# Patient Record
Sex: Male | Born: 1975 | Race: Black or African American | Hispanic: No | Marital: Single | Smoking: Former smoker
Health system: Southern US, Community
[De-identification: ages and names within clinical notes are randomized; demographics above are authoritative.]

## PROBLEM LIST (undated history)

## (undated) DIAGNOSIS — I2510 Atherosclerotic heart disease of native coronary artery without angina pectoris: Secondary | ICD-10-CM

## (undated) HISTORY — PX: CORONARY ARTERY BYPASS GRAFT: SHX141

## (undated) HISTORY — PX: CARDIAC CATHETERIZATION: SHX172

---

## 2001-02-02 ENCOUNTER — Emergency Department (HOSPITAL_COMMUNITY): Admission: EM | Admit: 2001-02-02 | Discharge: 2001-02-02 | Payer: Self-pay | Admitting: *Deleted

## 2002-05-17 ENCOUNTER — Emergency Department (HOSPITAL_COMMUNITY): Admission: EM | Admit: 2002-05-17 | Discharge: 2002-05-17 | Payer: Self-pay | Admitting: Emergency Medicine

## 2005-01-20 ENCOUNTER — Ambulatory Visit (HOSPITAL_BASED_OUTPATIENT_CLINIC_OR_DEPARTMENT_OTHER): Admission: RE | Admit: 2005-01-20 | Discharge: 2005-01-20 | Payer: Self-pay | Admitting: Otolaryngology

## 2005-01-24 ENCOUNTER — Ambulatory Visit: Payer: Self-pay | Admitting: Internal Medicine

## 2005-08-16 ENCOUNTER — Emergency Department (HOSPITAL_COMMUNITY): Admission: EM | Admit: 2005-08-16 | Discharge: 2005-08-16 | Payer: Self-pay | Admitting: Emergency Medicine

## 2006-06-12 ENCOUNTER — Ambulatory Visit (HOSPITAL_COMMUNITY): Admission: RE | Admit: 2006-06-12 | Discharge: 2006-06-13 | Payer: Self-pay | Admitting: Otolaryngology

## 2006-06-12 ENCOUNTER — Encounter (INDEPENDENT_AMBULATORY_CARE_PROVIDER_SITE_OTHER): Payer: Self-pay | Admitting: Specialist

## 2010-06-15 NOTE — H&P (Signed)
NAME:  Sergio Tyler, Sergio Tyler NO.:  [id number]   MEDICAL RECORD NO.:  [id number]          PATIENT TYPE:  AMB   LOCATION:  SDS                          FACILITY:  [facility]   PHYSICIAN:  Hermelinda Medicus, M.D.   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  He  has a considerable amount of trauma to his nose and also is a very  athletic individual with a very strong full neck.  He has considerable  snoring and sleep apnea issues and had a sleep study, which showed an  RDI of 44 and a lowest O2 nadir of 58%.  He had spent 19% of his time in  deep sleep, or REM sleep.  He has a septal deviation, turbinate  hypertrophy.  His nasal airway is very obstructed.  His oral cavity is  small.  His mouth is small with tonsils large and uvula that is low.  He  now enters for a septal reconstruction, turbinate reduction, and a UPP  with tonsillectomy.   The past history is one of no allergies to medications, drinks  occasionally, never smoked.  He has had no prior surgeries.  He has  seasonal allergies and takes over-the-counter medications.  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   PHYSICAL EXAMINATION:  VITAL SIGNS:  Reveals a blood pressure that is  slightly elevated at 164/97, pulse is 98, respiratory rate is 20.  Weight is 97.9.  His original SaO2 is 94% on room air.  HEENT:  The septum shows a deviation, primarily to the left with  turbinate hypertrophy that is extensive, that is blocking is nose.  His  oral cavity is small.  His tonsils are large.  His uvula is large, with  his palate being low.  His larynx is clear.  True cords, false cords,  epiglottis, base of tongue are clear.  True cord mobility, gag  reflex,  tongue mobility, EOMs, facial nerve are all symmetrical.  His neck is  free of any thyromegaly, cervical adenopathy, or mass.  CHEST:  Clear.  No rales, rhonchi, or wheezes.  CARDIOVASCULAR:  No murmurs or gallops.  ABDOMEN:  Unremarkable.  EXTREMITIES:  Unremarkable.   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   Our plan is to do a palatopharyngoplasty, tonsillectomy, septal  reconstruction, turbinate reduction under general endotracheal  anesthesia.  Will keep him overnight for observation.  The patient is  aware of the risks and gains.  He is aware of the dietetic needs and no  travel for ten days, and he is aware of the risk of bleeding, the risk  of anesthesia, the risk of postoperative sleep apnea.  ______________________________  Hermelinda Medicus, M.D.    JC/MEDQ  D:  06/12/2006  T:  06/12/2006  Job:  [id number]   cc:   Reuben Likes, M.D.

## 2010-06-15 NOTE — Op Note (Signed)
NAME:  Sergio Tyler, Sergio Tyler                  ACCOUNT NO.:  [id number]   MEDICAL RECORD NO.:  [id number]         PATIENT TYPE:  COIB   LOCATION:                               FACILITY:  [facility]   PHYSICIAN:  Hermelinda Medicus, M.D.   DATE OF BIRTH:  Aug 13, 1975   DATE OF PROCEDURE:  06/12/2006  DATE OF DISCHARGE:                               OPERATIVE REPORT   PREOPERATIVE DIAGNOSES:  Septal deviation, turbinate hypertrophy,  tonsillar hypertrophy with redundant uvula and palate, with sleep apnea,  Respiratory Disorder Index of 44, O2 nadir 58%, REM sleep 19%.   POSTOPERATIVE DIAGNOSES:  Septal deviation, turbinate hypertrophy,  tonsillar hypertrophy with redundant uvula and palate, with sleep apnea,  Respiratory Disorder Index of 44, O2 nadir 58%, REM sleep 19%.   OPERATION:  Septal reconstruction, turbinate reduction,  uvulopalatoplasty, and a tonsillectomy.   SURGEON:  Hermelinda Medicus, M.D.   ANESTHESIA:  General endotracheal.   PROCEDURE:  The patient was placed in supine position, and under general  endotracheal anesthesia, the patient was prepped and draped in the  appropriate manner.  The anesthesia was continued using local  anesthesia, using 1% Xylocaine with epinephrine 6 mL, and topical  cocaine 200 mg.  Once this was completed, the turbinates were  outfractured and aggressively reduced using the bipolar cautery set at  12.  The septum was then approached.  It was so severely scarred, we  made 2 incisions, 1 along the vomerine septum, and we approached that  using the 4-mm chisel and the Takahashi forceps, removed that portion of  the septal deviation, and then an ethmoid septal incision was made to  approach the deviation in that portion.  The columella incision was not  made because there was so much scar tissue in that area from previous  trauma.  Once this was corrected and the septum was in the midline,  closure was with 4-0 plain catgut and the mucous membrane incisions were  with 5-0 plain.  Plain 4-0 catgut was used as a through-and-through  septal suture as a hemostatic suture.  Once this was completed, the  septum was in the midline.  The turbinates were aggressively  outfractured and we had considerable nasal airway and a huge  improvement.  We then repositioned and placed the [facility] mouth gag in a  quite small mouth with a low uvula and palate and fairly large tonsils,  and removed the tonsils to gain some more space.  This was done using  the sharp, blunt and Bovie electrocoagulation dissection, and once the  tonsils were removed, we gained considerable space.  The uvula was  approximately 3 times its normal size, and this was trimmed back to a  more normal saline, and the palate was elevated also by trimming  approximately 7 mm.  All hemostasis was again checked.  The intranasal  dressing was removed and anesthesia trumpets were placed, and the  patient was taken to the recovery room in good condition.  He will be  kept overnight for observation at 3300, and will be kept on a pulse  oximeter.  His followup will be, then, in 3 days, and in 10 days, and 2  weeks, 3 weeks, 6 weeks, and 3 months and 6 months.          ______________________________  Hermelinda Medicus, M.D.    JC/MEDQ  D:  06/12/2006  T:  06/12/2006  Job:  [id number]   cc:   Reuben Likes, M.D.

## 2010-06-18 NOTE — Procedures (Signed)
NAME:  Sergio Tyler, SANFILIPPO NO.:  [id number]   MEDICAL RECORD NO.:  [id number]          PATIENT TYPE:  OUT   LOCATION:  SLEEP CENTER                 FACILITY:  [facility]   PHYSICIAN:  Clinton D. Maple Hudson, M.D. DATE OF BIRTH:  [birth year]   DATE OF STUDY:  01/20/2005                              NOCTURNAL POLYSOMNOGRAM   REFERRING PHYSICIAN:  Dr. Hermelinda Medicus.   DATE OF STUDY:  January 20, 2005.   INDICATIONS FOR STUDY:  Hypersomnia with sleep apnea. Epworth sleepiness  score 8/24, BMI 30, weight 214 pounds.   SLEEP ARCHITECTURE:  Total sleep time 406 minutes with sleep efficiency 98%.  Stage I was 1%, stage II 80%, stages III and IV absent, REM 19% of total  sleep time. Sleep latency 2 minutes, REM latency 74 minutes, awake after  sleep onset 5 minutes, arousal index 39.   RESPIRATORY DATA:  NPSG protocol. Apnea-hypopnea index (AHI, RDI) 44  obstructive events per hour indicating moderately severe obstructive sleep  apnea/hypopnea syndrome. There were 134 obstructive apneas and 164  hypopneas. Events were significant in all positions but most frequent while  supine. REM AHI 44 per hour.   OXYGEN DATA:  Very severe snoring especially while supine with oxygen  desaturation to a nadir of 58%. Mean oxygen saturation through the study was  94% on room air.   CARDIAC DATA:  Normal sinus rhythm.   MOVEMENT/PARASOMNIA:  Occasional leg jerk with little effect on sleep.   IMPRESSION/RECOMMENDATION:  1.  Moderately severe obstructive sleep apnea/hypopnea syndrome, apnea-      hypopnea index 44 per hour, worst while supine, with very loud snoring      and oxygen desaturation to a nadir of 58%.  2.  If clinically appropriate this patient could return for continuous      positive airway pressure titration and certainly should be considered      for therapeutic intervention.      Clinton D. Maple Hudson, M.D.  Diplomate, Biomedical engineer of Sleep Medicine  Electronically  Signed    CDY/MEDQ  D:  01/24/2005 17:20:26  T:  01/24/2005 22:09:16  Job:  [id number]

## 2012-03-25 ENCOUNTER — Emergency Department (HOSPITAL_COMMUNITY)
Admission: EM | Admit: 2012-03-25 | Discharge: 2012-03-25 | Disposition: A | Discharge status: Home / Self Care | Payer: PRIVATE HEALTH INSURANCE | Source: Home / Self Care | Attending: Family Medicine | Admitting: Family Medicine

## 2012-03-25 ENCOUNTER — Encounter (HOSPITAL_COMMUNITY): Payer: Self-pay | Admitting: *Deleted

## 2012-03-25 DIAGNOSIS — L250 Unspecified contact dermatitis due to cosmetics: Secondary | ICD-10-CM

## 2012-03-25 MED ORDER — FLUTICASONE PROPIONATE 0.05 % EX CREA: TOPICAL_CREAM | Freq: Two times a day (BID) | CUTANEOUS | Status: DC

## 2012-03-25 NOTE — ED Notes (Signed)
Patient complains of rash around beard and mouth x 2 weeks after using beard dye. Patient states rash will not resolve after 2 weeks of OTC treatment with ointment recommended by pharmacist. Patient states he now has fever and it is painful to eat.

## 2012-03-25 NOTE — ED Provider Notes (Signed)
History     CSN: [id number]  Arrival date & time 03/25/12  1538   First MD Initiated Contact with Patient 03/25/12 [phone]      Chief Complaint  Patient presents with  . Rash    (Consider location/radiation/quality/duration/timing/severity/associated sxs/prior treatment) Patient is a 37 y.o. male presenting with rash. The history is provided by the patient.  Rash Location:  Face Facial rash location:  Lip and chin Quality: blistering, itchiness and weeping   Severity:  Mild Duration:  2 weeks Progression:  Unchanged Chronicity:  New Context comment:  After using dye for beard.   History reviewed. No pertinent past medical history.  History reviewed. No pertinent past surgical history.  No family history on file.  History  Substance Use Topics  . Smoking status: Never Smoker   . Smokeless tobacco: Not on file  . Alcohol Use: Yes     Comment: occasional      Review of Systems  Constitutional: Negative.   Skin: Positive for rash.    Allergies  Review of patient's allergies indicates no known allergies.  Home Medications   Current Outpatient Rx  Name  Route  Sig  Dispense  Refill  . fluticasone (CUTIVATE) 0.05 % cream   Topical   Apply topically 2 (two) times daily.   30 g   0     BP 156/105  Pulse 86  Temp(Src) 99.2 F (37.3 C) (Oral)  Resp 16  SpO2 97%  Physical Exam  Nursing note and vitals reviewed. Constitutional: He is oriented to person, place, and time. He appears well-developed and well-nourished.  Neurological: He is alert and oriented to person, place, and time.  Skin: Skin is warm and dry. Rash noted.  Perioral facial weeping blistering rash.    ED Course  Procedures (including critical care time)  Labs Reviewed - No data to display No results found.   1. Contact dermatitis due to cosmetics       MDM          Linna Hoff, MD 03/25/12 1651

## 2012-06-17 ENCOUNTER — Encounter (HOSPITAL_COMMUNITY): Payer: Self-pay | Admitting: *Deleted

## 2012-06-17 ENCOUNTER — Emergency Department (INDEPENDENT_AMBULATORY_CARE_PROVIDER_SITE_OTHER)
Admission: EM | Admit: 2012-06-17 | Discharge: 2012-06-17 | Disposition: A | Discharge status: Home / Self Care | Payer: PRIVATE HEALTH INSURANCE | Source: Home / Self Care | Attending: Emergency Medicine | Admitting: Emergency Medicine

## 2012-06-17 DIAGNOSIS — L259 Unspecified contact dermatitis, unspecified cause: Secondary | ICD-10-CM

## 2012-06-17 MED ORDER — METHYLPREDNISOLONE ACETATE 80 MG/ML IJ SUSP
80.0000 mg | Freq: Once | INTRAMUSCULAR | Status: AC
  Administered 2012-06-17: 80 mg via INTRAMUSCULAR

## 2012-06-17 MED ORDER — METHYLPREDNISOLONE ACETATE 80 MG/ML IJ SUSP
INTRAMUSCULAR | Status: AC
  Filled 2012-06-17: qty 1

## 2012-06-17 MED ORDER — FLUTICASONE PROPIONATE 0.05 % EX CREA: TOPICAL_CREAM | Freq: Two times a day (BID) | CUTANEOUS | Status: DC

## 2012-06-17 MED ORDER — PREDNISONE 10 MG PO TABS: ORAL_TABLET | ORAL | Status: DC

## 2012-06-17 NOTE — ED Notes (Signed)
Pt    Reports  Facial  Swelling  l  Side  Face  After  Applying  Hair  Dye   sev  Days  Ago  The  Symptoms  Began   Later  On  In the       Day    After  Using the  Dye -  He  Is  Alert and  Oriented  And  Is  In no  Distress

## 2012-06-17 NOTE — ED Provider Notes (Signed)
Chief Complaint:   Chief Complaint  Patient presents with  . Allergic Reaction    History of Present Illness:   Sergio Tyler is a 37 year old male who applied a hair dye to his beard area 3 days ago. He felt a little tingling and itching in the area and soon thereafter broke out in a rash involving the entire beard area. His left upper lip swelled up. He denies any rash elsewhere, he's had no trouble breathing, wheezing, or swelling of his tongue or throat. He has had reactions to hair dyes in the past.  Review of Systems:  Other than noted above, the patient denies any of the following symptoms: Systemic:  No fever, chills, sweats, weight loss, or fatigue. ENT:  No nasal congestion, rhinorrhea, sore throat, swelling of lips, tongue or throat. Resp:  No cough, wheezing, or shortness of breath. Skin:  No rash, itching, nodules, or suspicious lesions.  PMFSH:  Past medical history, family history, social history, meds, and allergies were reviewed.  Physical Exam:   Vital signs:  BP 166/93  Pulse 74  Temp(Src) 100 F (37.8 C) (Oral)  Resp 17  SpO2 100% Gen:  Alert, oriented, in no distress. ENT:  Pharynx clear, no intraoral lesions, moist mucous membranes. Lungs:  Clear to auscultation. Skin:  There is a fine maculopapular rash involving the entire beard area where the dye was applied. Additionally there is a little bit of swelling of the left upper lip.  Course in Urgent Care Center:   Given Depo-Medrol 80 mg IM.  Assessment:  The encounter diagnosis was Contact dermatitis.  Plan:   1.  The following meds were prescribed:   New Prescriptions   FLUTICASONE (CUTIVATE) 0.05 % CREAM    Apply topically 2 (two) times daily.   PREDNISONE (DELTASONE) 10 MG TABLET    Take 4 tabs daily for 4 days, 3 tabs daily for 4 days, 2 tabs daily for 4 days, then 1 tab daily for 4 days.   2.  The patient was instructed in symptomatic care and handouts were given. He was cautioned not to apply any type  of hair or beard dye again in the future. 3.  The patient was told to return if becoming worse in any way, if no better in 3 or 4 days, and given some red flag symptoms such as worsening rash or difficulty breathing or fever that would indicate earlier return. 4.  Follow up if no better in 3-4 days here.     Reuben Likes, MD 06/17/12 [phone]

## 2012-12-15 ENCOUNTER — Encounter (HOSPITAL_COMMUNITY): Payer: Self-pay | Admitting: Emergency Medicine

## 2012-12-15 ENCOUNTER — Emergency Department (HOSPITAL_COMMUNITY)
Admission: EM | Admit: 2012-12-15 | Discharge: 2012-12-16 | Disposition: A | Discharge status: Home / Self Care | Payer: PRIVATE HEALTH INSURANCE | Attending: Emergency Medicine | Admitting: Emergency Medicine

## 2012-12-15 DIAGNOSIS — K08409 Partial loss of teeth, unspecified cause, unspecified class: Secondary | ICD-10-CM

## 2012-12-15 DIAGNOSIS — Y849 Medical procedure, unspecified as the cause of abnormal reaction of the patient, or of later complication, without mention of misadventure at the time of the procedure: Secondary | ICD-10-CM | POA: Insufficient documentation

## 2012-12-15 DIAGNOSIS — IMO0002 Reserved for concepts with insufficient information to code with codable children: Secondary | ICD-10-CM | POA: Insufficient documentation

## 2012-12-15 DIAGNOSIS — R58 Hemorrhage, not elsewhere classified: Secondary | ICD-10-CM

## 2012-12-15 MED ORDER — THROMBIN 5000 UNITS EX SOLR
Freq: Once | CUTANEOUS | Status: AC
  Administered 2012-12-15: 5000 [IU] via TOPICAL
  Filled 2012-12-15: qty 5000

## 2012-12-15 MED ORDER — "THROMBI-PAD 3""X3"" EX PADS": 1.0000 | MEDICATED_PAD | Freq: Once | CUTANEOUS | Status: DC

## 2012-12-15 NOTE — ED Notes (Signed)
Pt states he was at the free dental clinic today in [place]. He had dental work done at [facility] where they removed roots from a root canal that he had several years ago. He states he was injected/ numbed 9 times or more. Since 5pm, pt has not been able to get bleeding under control.

## 2012-12-15 NOTE — ED Notes (Signed)
Today, he had three teeth extracted in [place] at free clinic.  Has been bleeding since and gauze keeps saturating to the point that he keeps spitting blood.

## 2012-12-15 NOTE — ED Notes (Signed)
MD at bedside. 

## 2012-12-15 NOTE — ED Provider Notes (Signed)
CSN: [id number]     Arrival date & time 12/15/12  2124 History   First MD Initiated Contact with Patient 12/15/12 2148     Chief Complaint  Patient presents with  . Extraction Complication    (Consider location/radiation/quality/duration/timing/severity/associated sxs/prior Treatment) HPI Comments: Patient is a 37 year old male who presents today after having 3 teeth extracted he free dental clinic in [place] today. He is having issues with his left lower bicuspid socket bleeding. He has soaked through several towels and continues to bleed. He has had teeth pulled in the past and never had this issue. He is not bleeding from the other 2 sites that he had pulled today. He denies being on any anticoagulation, aspirin. He reports that he has no liver disease. He is in pain from the incision site.   The history is provided by the patient. No language interpreter was used.    History reviewed. No pertinent past medical history. History reviewed. No pertinent past surgical history. History reviewed. No pertinent family history. History  Substance Use Topics  . Smoking status: Never Smoker   . Smokeless tobacco: Not on file  . Alcohol Use: Yes     Comment: occasional    Review of Systems  Constitutional: Negative for fever and chills.  Respiratory: Negative for shortness of breath.   Cardiovascular: Negative for chest pain.  Gastrointestinal: Negative for vomiting and abdominal pain.  Hematological:       Bleeding from gum.   All other systems reviewed and are negative.    Allergies  Review of patient's allergies indicates no known allergies.  Home Medications   Current Outpatient Rx  Name  Route  Sig  Dispense  Refill  . fluticasone (CUTIVATE) 0.05 % cream   Topical   Apply topically 2 (two) times daily.   30 g   0   . fluticasone (CUTIVATE) 0.05 % cream   Topical   Apply topically 2 (two) times daily.   30 g   0   . predniSONE (DELTASONE) 10 MG tablet     Take 4 tabs daily for 4 days, 3 tabs daily for 4 days, 2 tabs daily for 4 days, then 1 tab daily for 4 days.   40 tablet   0    BP 136/93  Pulse 87  Temp(Src) 98.7 F (37.1 C) (Oral)  Resp 18  Ht 6' (1.829 m)  Wt 215 lb (97.523 kg)  BMI 29.15 kg/m2  SpO2 98% Physical Exam  Nursing note and vitals reviewed. Constitutional: He is oriented to person, place, and time. He appears well-developed and well-nourished. No distress.  HENT:  Head: Normocephalic and atraumatic.  Right Ear: External ear normal.  Left Ear: External ear normal.  Nose: Nose normal.  Mouth/Throat: Uvula is midline and oropharynx is clear and moist.    No trismus, submental edema, tongue elevation  Eyes: Conjunctivae are normal.  Neck: Normal range of motion. No tracheal deviation present.  Cardiovascular: Normal rate, regular rhythm and normal heart sounds.   Pulmonary/Chest: Effort normal and breath sounds normal. No stridor.  Abdominal: Soft. He exhibits no distension. There is no tenderness.  Musculoskeletal: Normal range of motion.  Neurological: He is alert and oriented to person, place, and time.  Skin: Skin is warm and dry. He is not diaphoretic.  Psychiatric: He has a normal mood and affect. His behavior is normal.    ED Course  Dental Date/Time: 12/15/2012 11:56 PM Performed by: Mora Bellman Authorized by: Mora Bellman  Consent: Verbal consent obtained. written consent not obtained. The procedure was performed in an emergent situation. Risks and benefits: risks, benefits and alternatives were discussed Consent given by: patient Patient understanding: patient states understanding of the procedure being performed Patient consent: the patient's understanding of the procedure matches consent given Required items: required blood products, implants, devices, and special equipment available Patient identity confirmed: verbally with patient and arm band Time out: Immediately prior to  procedure a "time out" was called to verify the correct patient, procedure, equipment, support staff and site/side marked as required. Local anesthesia used: yes Anesthesia: local infiltration and see MAR for details Local anesthetic: bupivacaine 0.25% with epinephrine Anesthetic total: 3.2 ml Patient sedated: no Patient tolerance: Patient tolerated the procedure well with no immediate complications.   (including critical care time) Labs Review Labs Reviewed - No data to display Imaging Review No results found.  EKG Interpretation   None       MDM   1. S/P tooth extraction, unspecified edentulism   2. Bleeding    Patient presents after dental extraction earlier today. Bleeding was not controlled initially upon arrival to the emergency department in socket of left lower bicuspid. Bleeding was controlled with use of a thrombin with gauze. Gel foam placed in the socket. Bleeding was ultimately controlled. Pt was observed in ED to ensure no further bleeding. Dr. Lynelle Doctor evaluated patient and assisted with procedure. Return instructions given and dental follow up given. Vital signs stable for discharge. Patient / Family / Caregiver informed of clinical course, understand medical decision-making process, and agree with plan.    Mora Bellman, PA-C 12/16/12 0120

## 2012-12-16 MED ORDER — MORPHINE SULFATE 4 MG/ML IJ SOLN
4.0000 mg | Freq: Once | INTRAMUSCULAR | Status: AC
  Administered 2012-12-16: 4 mg via INTRAMUSCULAR
  Filled 2012-12-16: qty 1

## 2012-12-16 MED ORDER — ONDANSETRON 8 MG PO TBDP
8.0000 mg | ORAL_TABLET | Freq: Once | ORAL | Status: AC
  Administered 2012-12-16: 8 mg via ORAL
  Filled 2012-12-16: qty 1

## 2012-12-16 NOTE — ED Notes (Addendum)
Patient is resting comfortably. Bleeding minimized, not using yanker as much.

## 2012-12-19 NOTE — ED Provider Notes (Signed)
Medical screening examination/treatment/procedure(s) were conducted as a shared visit with non-physician practitioner(s) and myself.  I personally evaluated the patient during the encounter.    Physical Exam  BP 149/89  Pulse 80  Temp(Src) 98.7 F (37.1 C) (Oral)  Resp 18  Ht 6' (1.829 m)  Wt 215 lb (97.523 kg)  BMI 29.15 kg/m2  SpO2 96%  Physical Exam  HENT:  Blood oozing primarily from the extracted teeth left lower mandibular region,     ED Course  Dental Date/Time: 12/19/2012 7:15 AM Performed by: Linwood Dibbles R Authorized by: Linwood Dibbles R Patient tolerance: Patient tolerated the procedure well with no immediate complications. Comments: Gelfoam placed in the sockets, thrombin soaked gauze applied above that and patient held pressure by biting down.  Bleeding was controlled    MDM After several attempts, bleeding was controlled.      Celene Kras, MD 12/19/12 [phone]

## 2015-12-03 ENCOUNTER — Telehealth (HOSPITAL_COMMUNITY): Payer: Self-pay | Admitting: *Deleted

## 2015-12-03 NOTE — Telephone Encounter (Signed)
Received signed md [facility]  Pt called and message left for him to please contact for additional information and sign up for class.  [facility] who [facility] to inquire status of cardiac rehab referral. Karlene Linemanarlette Ash Mcelwain RN, BSN

## 2015-12-31 ENCOUNTER — Encounter (HOSPITAL_COMMUNITY): Payer: Self-pay

## 2015-12-31 ENCOUNTER — Encounter (HOSPITAL_COMMUNITY)
Admission: RE | Admit: 2015-12-31 | Discharge: 2015-12-31 | Disposition: A | Discharge status: Home / Self Care | Payer: 59 | Source: Ambulatory Visit | Attending: Cardiology | Admitting: Cardiology

## 2015-12-31 VITALS — BP 142/92 | HR 68 | Ht 72.0 in | Wt 215.4 lb

## 2015-12-31 DIAGNOSIS — Z7982 Long term (current) use of aspirin: Secondary | ICD-10-CM | POA: Insufficient documentation

## 2015-12-31 DIAGNOSIS — I252 Old myocardial infarction: Secondary | ICD-10-CM | POA: Insufficient documentation

## 2015-12-31 DIAGNOSIS — Z7902 Long term (current) use of antithrombotics/antiplatelets: Secondary | ICD-10-CM | POA: Diagnosis not present

## 2015-12-31 DIAGNOSIS — I214 Non-ST elevation (NSTEMI) myocardial infarction: Secondary | ICD-10-CM

## 2015-12-31 DIAGNOSIS — Z79899 Other long term (current) drug therapy: Secondary | ICD-10-CM | POA: Insufficient documentation

## 2015-12-31 DIAGNOSIS — Z951 Presence of aortocoronary bypass graft: Secondary | ICD-10-CM | POA: Insufficient documentation

## 2015-12-31 HISTORY — DX: Atherosclerotic heart disease of native coronary artery without angina pectoris: I25.10

## 2015-12-31 NOTE — Progress Notes (Signed)
Cardiac Individual Treatment Plan  Patient Details  Name: Sergio Tyler MRN: [id number] Date of Birth: 1975/07/23 Referring Provider:   Flowsheet Row CARDIAC REHAB PHASE II ORIENTATION from 12/31/2015 in [facility] CARDIAC [facility]  Referring Provider  Harvie Bridge MD (Dr. Armanda Magic coverage)      Initial Encounter Date:  Flowsheet Row CARDIAC REHAB PHASE II ORIENTATION from 12/31/2015 in [facility] CARDIAC REHAB  Date  12/31/15  Referring Provider  Harvie Bridge MD (Dr. 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  aspirin EC 81 MG tablet, Take 81 mg by mouth daily., Disp: , Rfl:  .  atorvastatin (LIPITOR) 80 MG tablet, Take 80 mg by mouth daily at 6 PM., Disp: , Rfl:  .  clopidogrel (PLAVIX) 75 MG tablet, Take 75 mg by mouth daily., Disp: , Rfl:  .  lisinopril (PRINIVIL,ZESTRIL) 10 MG tablet, Take 10 mg by mouth daily., Disp: , Rfl:  .  metoprolol (TOPROL-XL) 200 MG 24 hr tablet, Take 200 mg by mouth daily., Disp: , Rfl:   Past Medical History: No past medical history on file.  Tobacco Use: History  Smoking Status  . Never Smoker  Smokeless Tobacco  . Not on file    Labs: Recent Review Flowsheet Data    There is no flowsheet data to display.      Capillary Blood Glucose: No results found for: GLUCAP   Exercise Target Goals: Date: 12/31/15  Exercise Program Goal: Individual exercise prescription set with THRR, safety & activity barriers. Participant demonstrates ability to understand and report RPE using BORG scale, to self-measure pulse accurately, and to acknowledge the importance of the exercise prescription.  Exercise Prescription Goal: Starting with aerobic activity 30 plus minutes a day, 3 days per week for initial exercise prescription. Provide home  exercise prescription and guidelines that participant acknowledges understanding prior to discharge.  Activity Barriers & Risk Stratification:     Activity Barriers & Cardiac Risk Stratification - 12/31/15 1121      Activity Barriers & Cardiac Risk Stratification   Activity Barriers Other (comment)   Comments pain with arm movement ( horz. abd/add)   Cardiac Risk Stratification High      6 Minute Walk:     6 Minute Walk    Row Name 12/31/15 1153         6 Minute Walk   Phase Initial     Distance 1910 feet     Walk Time 6 minutes     # of Rest Breaks 0     MPH 3.6     METS 5.7     RPE 11     VO2 Peak 20.1     Symptoms No     Resting HR 68 bpm     Resting BP 142/92     Max Ex. HR 102 bpm     Max Ex. BP 158/104     2 Minute Post BP 134/94        Initial Exercise Prescription:     Initial Exercise Prescription - 12/31/15 1100      Date of Initial Exercise RX and Referring Provider   Date 12/31/15   Referring Provider Harvie Bridge MD (Dr. Armanda Magic coverage)     Treadmill   MPH 3   Grade 1   Minutes 10  METs 3.71     Bike   Level 1.3   Minutes 10   METs 3.45     NuStep   Level 4   Minutes 10   METs 2.4     Prescription Details   Frequency (times per week) 3   Duration Progress to 30 minutes of continuous aerobic without signs/symptoms of physical distress     Intensity   THRR 40-80% of Max Heartrate 72-143   Ratings of Perceived Exertion 11-13     Resistance Training   Training Prescription Yes   Weight 4lbs   Reps 10-12      Perform Capillary Blood Glucose checks as needed.  Exercise Prescription Changes:   Exercise Comments:   Discharge Exercise Prescription (Final Exercise Prescription Changes):   Nutrition:  Target Goals: Understanding of nutrition guidelines, daily intake of sodium 1500mg , cholesterol 200mg , calories 30% from fat and 7% or less from saturated fats, daily to have 5 or more servings of fruits and  vegetables.  Biometrics:     Pre Biometrics - 12/31/15 1211      Pre Biometrics   Waist Circumference 38.75 inches   Hip Circumference 42 inches   Waist to Hip Ratio 0.92 %   Triceps Skinfold 14 mm   % Body Fat 25.9 %   Grip Strength 56 kg   Flexibility 0 in   Single Leg Stand 30 seconds       Nutrition Therapy Plan and Nutrition Goals:   Nutrition Discharge: Nutrition Scores:   Nutrition Goals Re-Evaluation:   Psychosocial: Target Goals: Acknowledge presence or absence of depression, maximize coping skills, provide positive support system. Participant is able to verbalize types and ability to use techniques and skills needed for reducing stress and depression.  Initial Review & Psychosocial Screening:     Initial Psych Review & Screening - 12/31/15 1434      Initial Review   Current issues with Current Stress Concerns   Source of Stress Concerns Occupation;Financial   Comments Verbalizes financial concerns due to not working and unsure of when he will be able to return to work.      Quality of Life Scores:     Quality of Life - 12/31/15 1221      Quality of Life Scores   Health/Function Pre 26.5 %   Socioeconomic Pre 21.25 %   Psych/Spiritual Pre 29.64 %   Family Pre 20.5 %   GLOBAL Pre 25.07 %      PHQ-9: Recent Review Flowsheet Data    There is no flowsheet data to display.      Psychosocial Evaluation and Intervention:   Psychosocial Re-Evaluation:   Vocational Rehabilitation: Provide vocational rehab assistance to qualifying candidates.   Vocational Rehab Evaluation & Intervention:     Vocational Rehab - 12/31/15 1437      Initial Vocational Rehab Evaluation & Intervention   Assessment shows need for Vocational Rehabilitation (P)  No     Discharge Vocational Rehab   Discharge Vocational Rehabilitation (P)  Although pt has not been cleared to retun back to work he feels he will be able to return back to work without any difficulty.       Education: Education Goals: Education classes will be provided on a weekly basis, covering required topics. Participant will state understanding/return demonstration of topics presented.  Learning Barriers/Preferences:     Learning Barriers/Preferences - 12/31/15 1209      Learning Barriers/Preferences   Learning Barriers Exercise Concerns  dizziness- orthostatics  Learning Preferences Written Material;Video;Skilled Demonstration;Pictoral;Audio      Education Topics: Count Your Pulse:  -Group instruction provided by verbal instruction, demonstration, patient participation and written materials to support subject.  Instructors address importance of being able to find your pulse and how to count your pulse when at home without a heart monitor.  Patients get hands on experience counting their pulse with staff help and individually.   Heart Attack, Angina, and Risk Factor Modification:  -Group instruction provided by verbal instruction, video, and written materials to support subject.  Instructors address signs and symptoms of angina and heart attacks.    Also discuss risk factors for heart disease and how to make changes to improve heart health risk factors.   Functional Fitness:  -Group instruction provided by verbal instruction, demonstration, patient participation, and written materials to support subject.  Instructors address safety measures for doing things around the house.  Discuss how to get up and down off the floor, how to pick things up properly, how to safely get out of a chair without assistance, and balance training.   Meditation and Mindfulness:  -Group instruction provided by verbal instruction, patient participation, and written materials to support subject.  Instructor addresses importance of mindfulness and meditation practice to help reduce stress and improve awareness.  Instructor also leads participants through a meditation exercise.    Stretching for  Flexibility and Mobility:  -Group instruction provided by verbal instruction, patient participation, and written materials to support subject.  Instructors lead participants through series of stretches that are designed to increase flexibility thus improving mobility.  These stretches are additional exercise for major muscle groups that are typically performed during regular warm up and cool down.   Hands Only CPR Anytime:  -Group instruction provided by verbal instruction, video, patient participation and written materials to support subject.  Instructors co-teach with AHA video for hands only CPR.  Participants get hands on experience with mannequins.   Nutrition I class: Heart Healthy Eating:  -Group instruction provided by PowerPoint slides, verbal discussion, and written materials to support subject matter. The instructor gives an explanation and review of the Therapeutic Lifestyle Changes diet recommendations, which includes a discussion on lipid goals, dietary fat, sodium, fiber, plant stanol/sterol esters, sugar, and the components of a well-balanced, healthy diet.   Nutrition II class: Lifestyle Skills:  -Group instruction provided by PowerPoint slides, verbal discussion, and written materials to support subject matter. The instructor gives an explanation and review of label reading, grocery shopping for heart health, heart healthy recipe modifications, and ways to make healthier choices when eating out.   Diabetes Question & Answer:  -Group instruction provided by PowerPoint slides, verbal discussion, and written materials to support subject matter. The instructor gives an explanation and review of diabetes co-morbidities, pre- and post-prandial blood glucose goals, pre-exercise blood glucose goals, signs, symptoms, and treatment of hypoglycemia and hyperglycemia, and foot care basics.   Diabetes Blitz:  -Group instruction provided by PowerPoint slides, verbal discussion, and written  materials to support subject matter. The instructor gives an explanation and review of the physiology behind type 1 and type 2 diabetes, diabetes medications and rational behind using different medications, pre- and post-prandial blood glucose recommendations and Hemoglobin A1c goals, diabetes diet, and exercise including blood glucose guidelines for exercising safely.    Portion Distortion:  -Group instruction provided by PowerPoint slides, verbal discussion, written materials, and food models to support subject matter. The instructor gives an explanation of serving size versus portion size, changes  in portions sizes over the last 20 years, and what consists of a serving from each food group.   Stress Management:  -Group instruction provided by verbal instruction, video, and written materials to support subject matter.  Instructors review role of stress in heart disease and how to cope with stress positively.     Exercising on Your Own:  -Group instruction provided by verbal instruction, power point, and written materials to support subject.  Instructors discuss benefits of exercise, components of exercise, frequency and intensity of exercise, and end points for exercise.  Also discuss use of nitroglycerin and activating EMS.  Review options of places to exercise outside of rehab.  Review guidelines for sex with heart disease.   Cardiac Drugs I:  -Group instruction provided by verbal instruction and written materials to support subject.  Instructor reviews cardiac drug classes: antiplatelets, anticoagulants, beta blockers, and statins.  Instructor discusses reasons, side effects, and lifestyle considerations for each drug class.   Cardiac Drugs II:  -Group instruction provided by verbal instruction and written materials to support subject.  Instructor reviews cardiac drug classes: angiotensin converting enzyme inhibitors (ACE-I), angiotensin II receptor blockers (ARBs), nitrates, and calcium  channel blockers.  Instructor discusses reasons, side effects, and lifestyle considerations for each drug class.   Anatomy and Physiology of the Circulatory System:  -Group instruction provided by verbal instruction, video, and written materials to support subject.  Reviews functional anatomy of heart, how it relates to various diagnoses, and what role the heart plays in the overall system.   Knowledge Questionnaire Score:     Knowledge Questionnaire Score - 12/31/15 1153      Knowledge Questionnaire Score   Pre Score 22/24      Core Components/Risk Factors/Patient Goals at Admission:     Personal Goals and Risk Factors at Admission - 12/31/15 1211      Core Components/Risk Factors/Patient Goals on Admission    Weight Management Yes;Weight Loss   Intervention Weight Management: Develop a combined nutrition and exercise program designed to reach desired caloric intake, while maintaining appropriate intake of nutrient and fiber, sodium and fats, and appropriate energy expenditure required for the weight goal.;Weight Management: Provide education and appropriate resources to help participant work on and attain dietary goals.;Weight Management/Obesity: Establish reasonable short term and long term weight goals.;Obesity: Provide education and appropriate resources to help participant work on and attain dietary goals.   Expected Outcomes Short Term: Continue to assess and modify interventions until short term weight is achieved;Long Term: Adherence to nutrition and physical activity/exercise program aimed toward attainment of established weight goal;Weight Maintenance: Understanding of the daily nutrition guidelines, which includes 25-35% calories from fat, 7% or less cal from saturated fats, less than 200mg  cholesterol, less than 1.5gm of sodium, & 5 or more servings of fruits and vegetables daily;Weight Loss: Understanding of general recommendations for a balanced deficit meal plan, which  promotes 1-2 lb weight loss per week and includes a negative energy balance of [phone] kcal/d;Understanding recommendations for meals to include 15-35% energy as protein, 25-35% energy from fat, 35-60% energy from carbohydrates, less than 200mg  of dietary cholesterol, 20-35 gm of total fiber daily;Understanding of distribution of calorie intake throughout the day with the consumption of 4-5 meals/snacks;Weight Gain: Understanding of general recommendations for a high calorie, high protein meal plan that promotes weight gain by distributing calorie intake throughout the day with the consumption for 4-5 meals, snacks, and/or supplements   Increase Strength and Stamina Yes   Intervention Provide advice,  education, support and counseling about physical activity/exercise needs.;Develop an individualized exercise prescription for aerobic and resistive training based on initial evaluation findings, risk stratification, comorbidities and participant's personal goals.   Expected Outcomes Achievement of increased cardiorespiratory fitness and enhanced flexibility, muscular endurance and strength shown through measurements of functional capacity and personal statement of participant.   Improve shortness of breath with ADL's Yes   Intervention Provide education, individualized exercise plan and daily activity instruction to help decrease symptoms of SOB with activities of daily living.   Expected Outcomes Short Term: Achieves a reduction of symptoms when performing activities of daily living.   Hypertension Yes   Intervention Provide education on lifestyle modifcations including regular physical activity/exercise, weight management, moderate sodium restriction and increased consumption of fresh fruit, vegetables, and low fat dairy, alcohol moderation, and smoking cessation.;Monitor prescription use compliance.   Expected Outcomes Short Term: Continued assessment and intervention until BP is < 140/8590mm HG in  hypertensive participants. < 130/5180mm HG in hypertensive participants with diabetes, heart failure or chronic kidney disease.;Long Term: Maintenance of blood pressure at goal levels.   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 Provide education on stretching and  exercising safely for cardiac patients   Expected Outcomes Pt wil be able to exercise independently without difficulty, improve in flexibility/mobility and improve cardiovascular endurance      Core Components/Risk Factors/Patient Goals Review:    Core Components/Risk Factors/Patient Goals at Discharge (Final Review):    ITP Comments:     ITP Comments    Row Name 12/31/15 1118           ITP Comments Dr. Armanda Magicraci Turner, Medical Director          Comments:  Pt is s/p 09/28/15 NSTEMI and 10[facility]13/[facility] per Dr. Ty HiltsKincaid (surgeon) and Dr. Jilda RocheVasu(cardiologist) is in today for cardiac rehab orientation from 0915 to 11:30.  As a part of the orientation appt pt completed warm up stretches and 6 minute walk test. Pt with elevated bp prior to walk test.  Please see previous note for details.  Await return call from the MD office regarding improvement of bp management.  Monitor showed SR with no ectopy.   Pt tolerated walk test with no difficulty or complaints.  Brief Psychosocial Assessment reveals financial concerns due to not cleared to return back to work. Pt is single and has a 143 year old son.   Pt does not have any other income and relying solely on his savings.  Pt given canned goods from rehab food pantry.  Pt is eager to get started in the rehab program. Karlene Linemanarlette Kalisa Girtman RN, BSN

## 2015-12-31 NOTE — Progress Notes (Addendum)
Pt called at 8:45 inquiring his whereabouts for 8:00 appt. Pt indicated that he thought his appt was at 9:00.  Pt arrived around 9:15.  Pt vital signs checked prior to his walk test.  Pt with elevated bp. 158/102. Questioned pt regarding his morning medications - Lisinopril 10 mg daily. Pt took his medications when he received the call from rehab.  Pt did not take any medications prior to his CABG  at [facility]. However pt was not under the care of MD so his prior medical history remains unknown.  Able to get bp down to 144/94 with rest, leg elevation.  Able to proceed with 6 minute walk test with return of elevated bp at the conclusion of his walk test 158/104 which in turn for cool down bp 134/94    Dr. Lenis NoonVasu office called and message left for Dr. Lenis NoonVasu nurse marked urgent.  Advised pt that we will need to have improved bp management in order to proceed with the cardiac rehab program.  Pt informed that with readings this high he would be very limited in his ability to proceed with exercise. Pt advised to hold on starting full exercsie at cardiac rehab until bp have greatly improved.  Pt verbalized understanding but did state he was disappointed.  Pt is eager to return back to work because he has no income coming into the home.  Pt is living off of his savings.  Pt has not received any indication of when he may return back to work.  Continued to offer support and encouragement.  Alanson Alyarlette Merle Whitehorn RN, BSN

## 2015-12-31 NOTE — Progress Notes (Signed)
Cardiac Rehab Medication Review by a Pharmacist  Does the patient  feel that his/her medications are working for him/her?  no  Has the patient been experiencing any side effects to the medications prescribed?  no  Does the patient measure his/her own blood pressure or blood glucose at home?  no   Does the patient have any problems obtaining medications due to transportation or finances?   no  Understanding of regimen: fair Understanding of indications: fair Potential of compliance: fair    Pharmacist comments: Mr. Sergio Tyler presents today for medication review after bypass surgery. He was not taking any medications before his surgery, so it was a difficult transition for him to start taking multiple medications after. I explained what each medication is for and why they are so important. He reports missing doses occasionally. I emphasized adherence and methods of remembering to take them. Patient verbalized understanding.    Mackie Paienee Baraa Tubbs, PharmD PGY1 Pharmacy Resident Pager: [phone] 12/31/2015 8:54 AM

## 2016-01-01 ENCOUNTER — Telehealth (HOSPITAL_COMMUNITY): Payer: Self-pay | Admitting: *Deleted

## 2016-01-01 NOTE — Telephone Encounter (Signed)
Received message from Dr. Onalee HuaAlvarez office.  BP reviewed.  Will add Norvasc 5 mg daily.  [facility]  Called and advised pt of new medication.  Asked pt to please start medication today and hold on starting exercise until Wednesday to allow 6 days of the medication.  Pt asked to please call to confirm message received.  Contact information provided. Alanson Alyarlette Carlton RN, BSN

## 2016-01-01 NOTE — Telephone Encounter (Signed)
Did not receive return call from Dr. Lenis NoonVasu office from message left for triage pool on 11/30.  Called second time requesting call back in regards to elevated bp.  Pt will not be able to participate in cardiac rehab until blood pressure readings are improved.

## 2016-01-04 ENCOUNTER — Encounter (HOSPITAL_COMMUNITY): Admission: RE | Admit: 2016-01-04 | Payer: 59 | Source: Ambulatory Visit

## 2016-01-04 ENCOUNTER — Telehealth (HOSPITAL_COMMUNITY): Payer: Self-pay | Admitting: *Deleted

## 2016-01-04 ENCOUNTER — Encounter (HOSPITAL_COMMUNITY): Payer: Self-pay

## 2016-01-04 NOTE — Telephone Encounter (Signed)
Unable to determine if message was received by patient regarding new medication for bp  Management.  Repeated calls to pt  go immediately to  Voicemail.  Call[facility] and [facility].  Determined pt has not picked up the new prescription.  Will try pt cell phone again later this morning. Alanson Alyarlette Latonya Nelon RN, BSN

## 2016-01-06 ENCOUNTER — Encounter (HOSPITAL_COMMUNITY)
Admission: RE | Admit: 2016-01-06 | Discharge: 2016-01-06 | Disposition: A | Discharge status: Home / Self Care | Payer: 59 | Source: Ambulatory Visit | Attending: Cardiology | Admitting: Cardiology

## 2016-01-06 DIAGNOSIS — I214 Non-ST elevation (NSTEMI) myocardial infarction: Secondary | ICD-10-CM

## 2016-01-06 DIAGNOSIS — Z7902 Long term (current) use of antithrombotics/antiplatelets: Secondary | ICD-10-CM | POA: Diagnosis not present

## 2016-01-06 DIAGNOSIS — Z951 Presence of aortocoronary bypass graft: Secondary | ICD-10-CM | POA: Insufficient documentation

## 2016-01-06 DIAGNOSIS — I252 Old myocardial infarction: Secondary | ICD-10-CM | POA: Insufficient documentation

## 2016-01-06 DIAGNOSIS — Z79899 Other long term (current) drug therapy: Secondary | ICD-10-CM | POA: Diagnosis not present

## 2016-01-06 DIAGNOSIS — Z7982 Long term (current) use of aspirin: Secondary | ICD-10-CM | POA: Insufficient documentation

## 2016-01-06 NOTE — Progress Notes (Signed)
Daily Session Note  Patient Details  Name: Sergio Tyler MRN: [id number] Date of Birth: [birth year] Referring Provider:   Flowsheet Row CARDIAC REHAB PHASE II ORIENTATION from 12/31/2015 in [place]  Referring Provider  Freddi Che MD (Dr. Fransico Him coverage)      Encounter Date: 01/06/2016  Check In:     Session Check In - 01/06/16 1514      Check-In   Location [facility]   Staff Present Cleda Mccreedy, MS, Exercise Physiologist;Amber Fair, MS, ACSM RCEP, Exercise Physiologist;Joann Rion, RN, Marga Melnick, RN, BSN   Supervising physician immediately available to respond to emergencies [facility] immediately available   Physician(s) Dr. Allyson Sabal    Medication changes reported     No   Fall or balance concerns reported    No   Warm-up and Cool-down Performed as group-led instruction   Resistance Training Performed No   VAD Patient? No     Pain Assessment   Currently in Pain? No/denies      Capillary Blood Glucose: No results found for this or any previous visit (from the past 24 hour(s)).   Goals Met:  Exercise tolerated well  Goals Unmet:  BP  Comments: Pt started cardiac rehab today.  Pt tolerated light exercise without difficulty. VSS, telemetry-Sinus rhythm, asymptomatic.  Medication list reconciled. Pt denies barriers to medicaiton compliance.  PSYCHOSOCIAL ASSESSMENT:  PHQ-0. Pt exhibits positive coping skills, hopeful outlook with supportive family. No psychosocial needs identified at this time, no psychosocial interventions necessary.    Pt enjoys being a DJ, renovating homes and welding.   Pt oriented to exercise equipment and routine.    Understanding verbalized. Nikki's initial blood pressure was 142/96 after resting Torsten's recheck blood pressure  Was 138/82. Upon review of Marque's medications. Salik took his lisinopril and his new prescription for amlodipine. Froilan did not take his metoprolol he  said he did not know he was supposed to take all three medications for his blood pressure. Skylur was able to walk the track  the treadmill today without difficulty.Max exertional blood pressure was noted at 158/82. Exit blood pressure noted at 136/83. Burnett was instructed to take all of his medications and to bring his bottles to exercise on Friday. Patient states understanding. Exit heart rate 96.Barnet Pall, RN,BSN 01/07/2016 9:57 AM   Dr. Fransico Him is Medical Director for Cardiac Rehab at [facility].

## 2016-01-07 ENCOUNTER — Telehealth (HOSPITAL_COMMUNITY): Payer: Self-pay | Admitting: *Deleted

## 2016-01-08 ENCOUNTER — Encounter (HOSPITAL_COMMUNITY)
Admission: RE | Admit: 2016-01-08 | Discharge: 2016-01-08 | Disposition: A | Discharge status: Home / Self Care | Payer: 59 | Source: Ambulatory Visit | Attending: Cardiology | Admitting: Cardiology

## 2016-01-11 ENCOUNTER — Encounter (HOSPITAL_COMMUNITY): Payer: 59

## 2016-01-13 ENCOUNTER — Encounter (HOSPITAL_COMMUNITY)
Admission: RE | Admit: 2016-01-13 | Discharge: 2016-01-13 | Disposition: A | Discharge status: Home / Self Care | Payer: 59 | Source: Ambulatory Visit | Attending: Cardiology | Admitting: Cardiology

## 2016-01-13 DIAGNOSIS — Z951 Presence of aortocoronary bypass graft: Secondary | ICD-10-CM

## 2016-01-13 DIAGNOSIS — I214 Non-ST elevation (NSTEMI) myocardial infarction: Secondary | ICD-10-CM

## 2016-01-15 ENCOUNTER — Encounter (HOSPITAL_COMMUNITY): Payer: 59

## 2016-01-18 ENCOUNTER — Encounter (HOSPITAL_COMMUNITY): Payer: 59

## 2016-01-20 ENCOUNTER — Encounter (HOSPITAL_COMMUNITY)
Admission: RE | Admit: 2016-01-20 | Discharge: 2016-01-20 | Disposition: A | Discharge status: Home / Self Care | Payer: 59 | Source: Ambulatory Visit | Attending: Cardiology | Admitting: Cardiology

## 2016-01-20 DIAGNOSIS — I214 Non-ST elevation (NSTEMI) myocardial infarction: Secondary | ICD-10-CM

## 2016-01-20 DIAGNOSIS — Z951 Presence of aortocoronary bypass graft: Secondary | ICD-10-CM

## 2016-01-22 ENCOUNTER — Encounter (HOSPITAL_COMMUNITY): Payer: 59

## 2016-01-27 ENCOUNTER — Encounter (HOSPITAL_COMMUNITY)
Admission: RE | Admit: 2016-01-27 | Discharge: 2016-01-27 | Disposition: A | Discharge status: Home / Self Care | Payer: 59 | Source: Ambulatory Visit | Attending: Cardiology | Admitting: Cardiology

## 2016-01-27 DIAGNOSIS — Z951 Presence of aortocoronary bypass graft: Secondary | ICD-10-CM

## 2016-01-27 DIAGNOSIS — I214 Non-ST elevation (NSTEMI) myocardial infarction: Secondary | ICD-10-CM

## 2016-01-28 NOTE — Progress Notes (Signed)
Discharge Summary  Patient Details  Name: Sergio Tyler MRN: [id number] Date of Birth: 03/25/1975 Referring Provider:   Flowsheet Row CARDIAC REHAB PHASE II ORIENTATION from 12/31/2015 in [place]  Referring Provider  Freddi Che MD (Dr. Fransico Him coverage)       Number of Visits: 6  Reason for Discharge:  Early Exit:  Insurance  Smoking History:  History  Smoking Status  . Former Smoker  Smokeless Tobacco  . 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 Fransico Him coverage)     Treadmill   MPH 3   Grade 1   Minutes 10   METs 3.71     Bike   Level 1.3   Minutes 10   METs 3.45     NuStep   Level 4   Minutes 10   METs 2.4     Prescription Details   Frequency (times per week) 3   Duration Progress to 30 minutes of continuous aerobic without signs/symptoms of physical distress     Intensity   THRR 40-80% of Max Heartrate 72-143   Ratings of Perceived Exertion 11-13     Resistance Training   Training Prescription Yes   Weight 4lbs   Reps 10-12      Discharge Exercise Prescription (Final Exercise Prescription Changes):     Exercise Prescription Changes - 02/15/16 1600      Response to Exercise   Blood Pressure (Admit) 130/88   Blood Pressure (Exercise) 190/110   Blood Pressure (Exit) 130/90   Heart Rate (Admit) 86 bpm   Heart Rate (Exercise) 110 bpm   Heart Rate (Exit) 70 bpm   Rating of Perceived Exertion (Exercise) 14   Duration Progress to 45 minutes of aerobic exercise without signs/symptoms of physical distress   Intensity THRR unchanged     Progression   Progression Continue to progress workloads to maintain intensity  without signs/symptoms of physical distress.   Average METs 3.9     Resistance Training   Training Prescription Yes   Weight 4lbs   Reps 10-12     Treadmill   MPH 2.5   Grade 0   Minutes 10   METs 2.91     Bike   Level 1.8   Minutes 10   METs 4.44     NuStep   Level 4   Minutes 10   METs 4.4     Home Exercise Plan   Plans to continue exercise at Home   Frequency Add 4 additional days to program exercise sessions.      Functional Capacity:     6 Minute Walk    Row Name 12/31/15 1153 02/15/16 1651       6 Minute Walk   Phase Initial Discharge    Distance 1910 feet 1931 feet    Distance % Change  - 1.1 %    Walk Time 6 minutes 6 minutes    # of Rest Breaks 0 0    MPH 3.6 3.7    METS 5.7 5.8    RPE 11 9    VO2 Peak 20.1 20.2    Symptoms No No  Resting HR 68 bpm 86 bpm    Resting BP 142/92 130/88    Max Ex. HR 102 bpm 107 bpm    Max Ex. BP 158/104 152/88    2 Minute Post BP 134/94 140/88       Psychological, QOL, Others - Outcomes: PHQ 2/9: Depression screen PHQ 2/9 02/25/2016  Decreased Interest 0  Down, Depressed, Hopeless 0  PHQ - 2 Score 0    Quality of Life:     Quality of Life - 02/15/16 1658      Quality of Life Scores   Health/Function Pre 26.5 %   Health/Function Post 29.43 %   Health/Function % Change 11.06 %   Socioeconomic Pre 21.25 %   Socioeconomic Post 29.25 %   Socioeconomic % Change  37.65 %   Psych/Spiritual Pre 29.64 %   Psych/Spiritual Post 29.14 %   Psych/Spiritual % Change -1.69 %   Family Pre 20.5 %   Family Post 29.5 %   Family % Change 43.9 %   GLOBAL Pre 25.07 %   GLOBAL Post 29.34 %   GLOBAL % Change 17.03 %      Personal Goals: Goals established at orientation with interventions provided to work toward goal.Pt graduated from cardiac rehab program today with completion of 6exercise sessions in Phase II. Pt maintained good attendance and progressed nicely during his participation in rehab as evidenced by  increased MET level.   Medication list reconciled. Repeat  PHQ score-  .  Pt has made significant lifestyle changes and should be commended for his success. Pt feels he has achieved his goals during cardiac rehab.   Pt plans to continue exercise on his own and is considering joining the gym.[name] RN BSN     Personal Goals and Risk Factors at Admission - 12/31/15 1211      Core Components/Risk Factors/Patient Goals on Admission    Weight Management Yes;Weight Loss   Intervention Weight Management: Develop a combined nutrition and exercise program designed to reach desired caloric intake, while maintaining appropriate intake of nutrient and fiber, sodium and fats, and appropriate energy expenditure required for the weight goal.;Weight Management: Provide education and appropriate resources to help participant work on and attain dietary goals.;Weight Management/Obesity: Establish reasonable short term and long term weight goals.;Obesity: Provide education and appropriate resources to help participant work on and attain dietary goals.   Expected Outcomes Short Term: Continue to assess and modify interventions until short term weight is achieved;Long Term: Adherence to nutrition and physical activity/exercise program aimed toward attainment of established weight goal;Weight Maintenance: Understanding of the daily nutrition guidelines, which includes 25-35% calories from fat, 7% or less cal from saturated fats, less than 2107m cholesterol, less than 1.5gm of sodium, & 5 or more servings of fruits and vegetables daily;Weight Loss: Understanding of general recommendations for a balanced deficit meal plan, which promotes 1-2 lb weight loss per week and includes a negative energy balance of [phone] kcal/d;Understanding recommendations for meals to include 15-35% energy as protein, 25-35% energy from fat, 35-60% energy from carbohydrates, less than 2061mof dietary cholesterol, 20-35 gm of total fiber  daily;Understanding of distribution of calorie intake throughout the day with the consumption of 4-5 meals/snacks;Weight Gain: Understanding of general recommendations for a high calorie, high protein meal plan that promotes weight gain by distributing calorie intake throughout the day with the consumption for 4-5 meals, snacks, and/or supplements   Increase Strength and Stamina Yes   Intervention Provide advice, education, support  and counseling about physical activity/exercise needs.;Develop an individualized exercise prescription for aerobic and resistive training based on initial evaluation findings, risk stratification, comorbidities and participant's personal goals.   Expected Outcomes Achievement of increased cardiorespiratory fitness and enhanced flexibility, muscular endurance and strength shown through measurements of functional capacity and personal statement of participant.   Improve shortness of breath with ADL's Yes   Intervention Provide education, individualized exercise plan and daily activity instruction to help decrease symptoms of SOB with activities of daily living.   Expected Outcomes Short Term: Achieves a reduction of symptoms when performing activities of daily living.   Hypertension Yes   Intervention Provide education on lifestyle modifcations including regular physical activity/exercise, weight management, moderate sodium restriction and increased consumption of fresh fruit, vegetables, and low fat dairy, alcohol moderation, and smoking cessation.;Monitor prescription use compliance.   Expected Outcomes Short Term: Continued assessment and intervention until BP is < 140/59m HG in hypertensive participants. < 130/867mHG in hypertensive participants with diabetes, heart failure or chronic kidney disease.;Long Term: Maintenance of blood pressure at goal levels.   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 Provide education on stretching and  exercising safely for cardiac patients   Expected Outcomes Pt wil be able to exercise independently without difficulty, improve in flexibility/mobility and improve cardiovascular endurance       Personal Goals Discharge:     Goals and Risk Factor Review    Row Name 01/06/16 1713             Core Components/Risk Factors/Patient Goals Review   Personal Goals Review Increase Strength and Stamina;Other       Review Pt will begin new BP meds in order to reduce his BP, then he should be able to continue with exercise and increase workloads as tolerated       Expected Outcomes Continue with exercise program and get BP controlled with new meds in order to tolerate exercise workloads          Nutrition & Weight - Outcomes:     Pre Biometrics - 12/31/15 1211      Pre Biometrics   Waist Circumference 38.75 inches   Hip Circumference 42 inches   Waist to Hip Ratio 0.92 %   Triceps Skinfold 14 mm   % Body Fat 25.9 %   Grip Strength 56 kg   Flexibility 0 in   Single Leg Stand 30 seconds         Post Biometrics - 02/15/16 1657       Post  Biometrics   Height 6' (1.829 m)   Weight 216 lb 4.3 oz (98.1 kg)   Waist Circumference 39 inches   Hip Circumference 44.5 inches   Waist to Hip Ratio 0.88 %   BMI (Calculated) 29.4   Triceps Skinfold 21 mm   % Body Fat 27.7 %   Grip Strength 61 kg   Flexibility 0 in   Single Leg Stand 30 seconds      Nutrition:     Nutrition Therapy & Goals - 01/11/16 0929      Nutrition Therapy   Diet Therapeutic Lifestyle Changes     Personal Nutrition Goals   Personal Goal #1 Wt loss of 1-2 lb/week to a wt loss goal of 6-24 lb  at graduation from [place], educate and counsel regarding individualized  specific dietary modifications aiming towards targeted core components such as weight, hypertension, lipid management, diabetes, heart failure and other comorbidities.   Expected Outcomes Short Term Goal: Understand basic principles of dietary content, such as calories, fat, sodium, cholesterol and nutrients.;Long Term Goal: Adherence to prescribed nutrition plan.      Nutrition Discharge:     Nutrition Assessments - 02/17/16 1037      MEDFICTS Scores   Pre Score 30   Post Score 24   Score Difference -6      Education Questionnaire Score:     Knowledge Questionnaire Score - 02/15/16 1651      Knowledge Questionnaire Score   Post Score 17/24      Goals reviewed with patient; copy given to patient.Willard graduates from cardiac rehab program Friday with completion of 36 exercise sessions in Phase II. Pt maintained good attendance and progressed nicely during his participation in rehab as evidenced by increased MET level.   Medication list reconciled. Repeat  PHQ score- 0 .  Pt has made significant lifestyle changes and should be commended for his success. Pt feels he has achieved his goals during cardiac rehab.   Pt plans to continue exercising on his own.[name] RN BSN

## 2016-01-29 ENCOUNTER — Encounter (HOSPITAL_COMMUNITY)
Admission: RE | Admit: 2016-01-29 | Discharge: 2016-01-29 | Disposition: A | Discharge status: Home / Self Care | Payer: 59 | Source: Ambulatory Visit | Attending: Cardiology | Admitting: Cardiology

## 2016-01-29 VITALS — Ht 72.0 in | Wt 216.3 lb

## 2016-01-29 DIAGNOSIS — Z951 Presence of aortocoronary bypass graft: Secondary | ICD-10-CM

## 2016-01-29 DIAGNOSIS — I214 Non-ST elevation (NSTEMI) myocardial infarction: Secondary | ICD-10-CM

## 2016-01-29 NOTE — Progress Notes (Signed)
Marguerite completed cardiac rehab today and decided to increase his incline up to 15 to "see what would happen." incline was decreased back to 0. Blood pressure noted at 190/110. Patient switched to the track. Repeat blood pressure noted at 184/112. Dr Augusto GambleAlvarez's office  [facility]. I spoke with Lupita Leashonna the triage nurse. Repeat blood pressure noted at 110/80 sitting then 130/90. Exit blood pressure noted at 132/80 with a heart rate of 70.Will fax exercise flow sheets to Dr. Augusto GambleAlvarez's office for review. Ayesha RumpfColin had no complaints upon exit from cardiac rehab.Thayer HeadingsMaria Walden Sharna Gabrys RN BSN Thayer HeadingsMaria Walden Shamal Stracener RN BSN.

## 2016-02-03 ENCOUNTER — Encounter (HOSPITAL_COMMUNITY): Admission: RE | Admit: 2016-02-03 | Payer: PRIVATE HEALTH INSURANCE | Source: Ambulatory Visit

## 2016-02-03 NOTE — Progress Notes (Signed)
Reviewed home exercise program with pt.  Discussed mode/frequency of exercise, THRR, RPE and weather conditions for exercising outdoors.  Also discussed sign and symptoms and when to call Dr./911.  Pt verbalized understanding.  Rosine DoorAshley Camie Hauss, MS 02/03/2016 4:30

## 2016-02-05 ENCOUNTER — Encounter (HOSPITAL_COMMUNITY): Payer: PRIVATE HEALTH INSURANCE

## 2016-02-08 ENCOUNTER — Encounter (HOSPITAL_COMMUNITY): Payer: PRIVATE HEALTH INSURANCE

## 2016-02-10 ENCOUNTER — Encounter (HOSPITAL_COMMUNITY): Payer: PRIVATE HEALTH INSURANCE

## 2016-02-12 ENCOUNTER — Encounter (HOSPITAL_COMMUNITY): Payer: PRIVATE HEALTH INSURANCE

## 2016-02-15 ENCOUNTER — Encounter (HOSPITAL_COMMUNITY): Payer: PRIVATE HEALTH INSURANCE

## 2016-02-15 NOTE — Addendum Note (Signed)
Encounter addended by: Ples SpecterAshley L Samual Beals on: 02/15/2016  5:02 PM<BR>    Actions taken: Flowsheet accepted, Flowsheet data copied forward, Visit Navigator Flowsheet section accepted

## 2016-02-17 ENCOUNTER — Encounter (HOSPITAL_COMMUNITY): Payer: PRIVATE HEALTH INSURANCE

## 2016-02-17 NOTE — Addendum Note (Signed)
Encounter addended by: Jacques EarthlyEdna Brewbaker Schuyler Behan, RD on: 02/17/2016 10:37 AM<BR>    Actions taken: Flowsheet data copied forward, Visit Navigator Flowsheet section accepted

## 2016-02-19 ENCOUNTER — Encounter (HOSPITAL_COMMUNITY): Payer: PRIVATE HEALTH INSURANCE

## 2016-02-22 ENCOUNTER — Encounter (HOSPITAL_COMMUNITY): Payer: PRIVATE HEALTH INSURANCE

## 2016-02-24 ENCOUNTER — Encounter (HOSPITAL_COMMUNITY): Payer: PRIVATE HEALTH INSURANCE

## 2016-02-25 NOTE — Addendum Note (Signed)
Encounter addended by: Cammy CopaMaria W Whitaker, RN on: 02/25/2016  2:23 PM<BR>    Actions taken: Visit Navigator Flowsheet section accepted

## 2016-02-26 ENCOUNTER — Encounter (HOSPITAL_COMMUNITY): Payer: PRIVATE HEALTH INSURANCE

## 2016-02-29 ENCOUNTER — Encounter (HOSPITAL_COMMUNITY): Payer: PRIVATE HEALTH INSURANCE

## 2016-03-02 ENCOUNTER — Encounter (HOSPITAL_COMMUNITY): Payer: PRIVATE HEALTH INSURANCE

## 2016-03-04 ENCOUNTER — Encounter (HOSPITAL_COMMUNITY): Payer: PRIVATE HEALTH INSURANCE

## 2016-03-07 ENCOUNTER — Encounter (HOSPITAL_COMMUNITY): Payer: PRIVATE HEALTH INSURANCE

## 2016-03-09 ENCOUNTER — Encounter (HOSPITAL_COMMUNITY): Payer: PRIVATE HEALTH INSURANCE

## 2016-03-11 ENCOUNTER — Encounter (HOSPITAL_COMMUNITY): Payer: PRIVATE HEALTH INSURANCE

## 2016-03-14 ENCOUNTER — Encounter (HOSPITAL_COMMUNITY): Payer: PRIVATE HEALTH INSURANCE

## 2016-03-16 ENCOUNTER — Encounter (HOSPITAL_COMMUNITY): Payer: PRIVATE HEALTH INSURANCE

## 2016-03-18 ENCOUNTER — Encounter (HOSPITAL_COMMUNITY): Payer: PRIVATE HEALTH INSURANCE

## 2016-03-21 ENCOUNTER — Encounter (HOSPITAL_COMMUNITY): Payer: PRIVATE HEALTH INSURANCE

## 2016-03-23 ENCOUNTER — Encounter (HOSPITAL_COMMUNITY): Payer: PRIVATE HEALTH INSURANCE

## 2016-03-25 ENCOUNTER — Encounter (HOSPITAL_COMMUNITY): Payer: PRIVATE HEALTH INSURANCE

## 2016-03-28 ENCOUNTER — Encounter (HOSPITAL_COMMUNITY): Payer: PRIVATE HEALTH INSURANCE

## 2016-03-30 ENCOUNTER — Encounter (HOSPITAL_COMMUNITY): Payer: PRIVATE HEALTH INSURANCE

## 2016-04-01 ENCOUNTER — Encounter (HOSPITAL_COMMUNITY): Payer: PRIVATE HEALTH INSURANCE

## 2016-04-04 ENCOUNTER — Encounter (HOSPITAL_COMMUNITY): Payer: PRIVATE HEALTH INSURANCE

## 2016-04-06 ENCOUNTER — Encounter (HOSPITAL_COMMUNITY): Payer: PRIVATE HEALTH INSURANCE

## 2016-04-08 ENCOUNTER — Encounter (HOSPITAL_COMMUNITY): Payer: PRIVATE HEALTH INSURANCE

## 2016-04-11 ENCOUNTER — Encounter (HOSPITAL_COMMUNITY): Payer: PRIVATE HEALTH INSURANCE

## 2016-04-13 ENCOUNTER — Encounter (HOSPITAL_COMMUNITY): Payer: PRIVATE HEALTH INSURANCE

## 2016-04-15 ENCOUNTER — Encounter (HOSPITAL_COMMUNITY): Payer: PRIVATE HEALTH INSURANCE

## 2016-04-18 ENCOUNTER — Encounter (HOSPITAL_COMMUNITY): Payer: PRIVATE HEALTH INSURANCE

## 2016-04-20 ENCOUNTER — Encounter (HOSPITAL_COMMUNITY): Payer: PRIVATE HEALTH INSURANCE

## 2016-04-22 ENCOUNTER — Encounter (HOSPITAL_COMMUNITY): Payer: PRIVATE HEALTH INSURANCE

## 2016-04-25 ENCOUNTER — Encounter (HOSPITAL_COMMUNITY): Payer: PRIVATE HEALTH INSURANCE

## 2016-04-27 ENCOUNTER — Encounter (HOSPITAL_COMMUNITY): Payer: PRIVATE HEALTH INSURANCE

## 2016-04-29 ENCOUNTER — Encounter (HOSPITAL_COMMUNITY): Payer: PRIVATE HEALTH INSURANCE

## 2016-05-02 ENCOUNTER — Encounter (HOSPITAL_COMMUNITY): Payer: PRIVATE HEALTH INSURANCE

## 2019-03-08 ENCOUNTER — Ambulatory Visit: Payer: Self-pay | Attending: Internal Medicine

## 2019-03-08 DIAGNOSIS — Z20822 Contact with and (suspected) exposure to covid-19: Secondary | ICD-10-CM | POA: Insufficient documentation

## 2019-03-10 LAB — NOVEL CORONAVIRUS, NAA: SARS-CoV-2, NAA: NOT DETECTED

## 2019-03-13 ENCOUNTER — Ambulatory Visit: Payer: Self-pay | Attending: Internal Medicine

## 2019-03-13 DIAGNOSIS — Z20822 Contact with and (suspected) exposure to covid-19: Secondary | ICD-10-CM | POA: Insufficient documentation

## 2019-03-14 LAB — NOVEL CORONAVIRUS, NAA: SARS-CoV-2, NAA: NOT DETECTED

## 2020-08-09 ENCOUNTER — Encounter (HOSPITAL_COMMUNITY): Payer: Self-pay

## 2020-08-09 ENCOUNTER — Other Ambulatory Visit: Payer: Self-pay

## 2020-08-09 ENCOUNTER — Emergency Department (HOSPITAL_COMMUNITY): Payer: BC Managed Care – PPO

## 2020-08-09 ENCOUNTER — Emergency Department (HOSPITAL_COMMUNITY)
Admission: EM | Admit: 2020-08-09 | Discharge: 2020-08-09 | Disposition: A | Discharge status: Home / Self Care | Payer: BC Managed Care – PPO | Attending: Emergency Medicine | Admitting: Emergency Medicine

## 2020-08-09 DIAGNOSIS — I2510 Atherosclerotic heart disease of native coronary artery without angina pectoris: Secondary | ICD-10-CM | POA: Diagnosis not present

## 2020-08-09 DIAGNOSIS — Z7982 Long term (current) use of aspirin: Secondary | ICD-10-CM | POA: Insufficient documentation

## 2020-08-09 DIAGNOSIS — Z951 Presence of aortocoronary bypass graft: Secondary | ICD-10-CM | POA: Diagnosis not present

## 2020-08-09 DIAGNOSIS — Z7902 Long term (current) use of antithrombotics/antiplatelets: Secondary | ICD-10-CM | POA: Insufficient documentation

## 2020-08-09 DIAGNOSIS — Z87891 Personal history of nicotine dependence: Secondary | ICD-10-CM | POA: Insufficient documentation

## 2020-08-09 DIAGNOSIS — S43004A Unspecified dislocation of right shoulder joint, initial encounter: Secondary | ICD-10-CM

## 2020-08-09 DIAGNOSIS — Z79899 Other long term (current) drug therapy: Secondary | ICD-10-CM | POA: Insufficient documentation

## 2020-08-09 DIAGNOSIS — S4991XA Unspecified injury of right shoulder and upper arm, initial encounter: Secondary | ICD-10-CM | POA: Diagnosis present

## 2020-08-09 DIAGNOSIS — S43014A Anterior dislocation of right humerus, initial encounter: Secondary | ICD-10-CM | POA: Insufficient documentation

## 2020-08-09 MED ORDER — PROPOFOL 10 MG/ML IV BOLUS
INTRAVENOUS | Status: AC | PRN
  Administered 2020-08-09 (×2): 38.55 mg via INTRAVENOUS

## 2020-08-09 MED ORDER — PROPOFOL 10 MG/ML IV BOLUS
INTRAVENOUS | Status: AC
  Filled 2020-08-09: qty 20

## 2020-08-09 MED ORDER — PROPOFOL 10 MG/ML IV BOLUS
INTRAVENOUS | Status: AC
  Administered 2020-08-09: 38.6 mg via INTRAVENOUS
  Filled 2020-08-09: qty 20

## 2020-08-09 MED ORDER — PROPOFOL 10 MG/ML IV BOLUS: 0.5000 mg/kg | Freq: Once | INTRAVENOUS | Status: AC

## 2020-08-09 MED ORDER — SODIUM CHLORIDE 0.9 % IV BOLUS
500.0000 mL | Freq: Once | INTRAVENOUS | Status: AC
  Administered 2020-08-09: 500 mL via INTRAVENOUS

## 2020-08-09 MED ORDER — ONDANSETRON HCL 4 MG/2ML IJ SOLN
4.0000 mg | Freq: Once | INTRAMUSCULAR | Status: AC
  Administered 2020-08-09: 4 mg via INTRAVENOUS
  Filled 2020-08-09: qty 2

## 2020-08-09 MED ORDER — KETOROLAC TROMETHAMINE 30 MG/ML IJ SOLN
30.0000 mg | Freq: Once | INTRAMUSCULAR | Status: AC
  Administered 2020-08-09: 30 mg via INTRAVENOUS
  Filled 2020-08-09: qty 1

## 2020-08-09 MED ORDER — DICLOFENAC SODIUM ER 100 MG PO TB24: 100.0000 mg | ORAL_TABLET | Freq: Every day | ORAL | 0 refills | Status: AC

## 2020-08-09 NOTE — Progress Notes (Signed)
Orthopedic Tech Progress Note Patient Details:  Sergio Tyler Nov 27, 1975 [id number]  Ortho Devices Type of Ortho Device: Sling immobilizer Ortho Device/Splint Location: rue. Applied post reduction Ortho Device/Splint Interventions: Ordered, Application, Adjustment   Post Interventions Patient Tolerated: Well Instructions Provided: Care of device, Adjustment of device  Trinna Post 08/09/2020, 4:31 AM
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
## 2020-08-09 NOTE — ED Notes (Signed)
Patient transported to X-ray 

## 2020-08-09 NOTE — Sedation Documentation (Signed)
Ortho tech applied shoulder immobilizer.  

## 2022-03-10 ENCOUNTER — Encounter (HOSPITAL_BASED_OUTPATIENT_CLINIC_OR_DEPARTMENT_OTHER): Payer: Self-pay | Admitting: Emergency Medicine

## 2022-03-10 ENCOUNTER — Emergency Department (HOSPITAL_BASED_OUTPATIENT_CLINIC_OR_DEPARTMENT_OTHER)
Admission: EM | Admit: 2022-03-10 | Discharge: 2022-03-10 | Disposition: A | Discharge status: Home / Self Care | Payer: BC Managed Care – PPO | Attending: Emergency Medicine | Admitting: Emergency Medicine

## 2022-03-10 ENCOUNTER — Other Ambulatory Visit: Payer: Self-pay

## 2022-03-10 ENCOUNTER — Emergency Department (HOSPITAL_BASED_OUTPATIENT_CLINIC_OR_DEPARTMENT_OTHER): Payer: BC Managed Care – PPO

## 2022-03-10 DIAGNOSIS — J020 Streptococcal pharyngitis: Secondary | ICD-10-CM | POA: Diagnosis not present

## 2022-03-10 DIAGNOSIS — R590 Localized enlarged lymph nodes: Secondary | ICD-10-CM | POA: Insufficient documentation

## 2022-03-10 DIAGNOSIS — Z20822 Contact with and (suspected) exposure to covid-19: Secondary | ICD-10-CM | POA: Insufficient documentation

## 2022-03-10 DIAGNOSIS — E119 Type 2 diabetes mellitus without complications: Secondary | ICD-10-CM | POA: Insufficient documentation

## 2022-03-10 DIAGNOSIS — Z951 Presence of aortocoronary bypass graft: Secondary | ICD-10-CM | POA: Diagnosis not present

## 2022-03-10 DIAGNOSIS — J029 Acute pharyngitis, unspecified: Secondary | ICD-10-CM | POA: Diagnosis present

## 2022-03-10 DIAGNOSIS — I2510 Atherosclerotic heart disease of native coronary artery without angina pectoris: Secondary | ICD-10-CM | POA: Diagnosis not present

## 2022-03-10 DIAGNOSIS — Z7902 Long term (current) use of antithrombotics/antiplatelets: Secondary | ICD-10-CM | POA: Insufficient documentation

## 2022-03-10 DIAGNOSIS — D72829 Elevated white blood cell count, unspecified: Secondary | ICD-10-CM | POA: Insufficient documentation

## 2022-03-10 DIAGNOSIS — A419 Sepsis, unspecified organism: Secondary | ICD-10-CM | POA: Diagnosis not present

## 2022-03-10 DIAGNOSIS — Z7982 Long term (current) use of aspirin: Secondary | ICD-10-CM | POA: Diagnosis not present

## 2022-03-10 LAB — CBC
HCT: 37.8 % — ABNORMAL LOW (ref 39.0–52.0)
Hemoglobin: 13.3 g/dL (ref 13.0–17.0)
MCH: 29.6 pg (ref 26.0–34.0)
MCHC: 35.2 g/dL (ref 30.0–36.0)
MCV: 84.2 fL (ref 80.0–100.0)
Platelets: 232 10*3/uL (ref 150–400)
RBC: 4.49 MIL/uL (ref 4.22–5.81)
RDW: 11.9 % (ref 11.5–15.5)
WBC: 15.2 10*3/uL — ABNORMAL HIGH (ref 4.0–10.5)
nRBC: 0 % (ref 0.0–0.2)

## 2022-03-10 LAB — BASIC METABOLIC PANEL
Anion gap: 12 (ref 5–15)
BUN: 10 mg/dL (ref 6–20)
CO2: 26 mmol/L (ref 22–32)
Calcium: 10.1 mg/dL (ref 8.9–10.3)
Chloride: 97 mmol/L — ABNORMAL LOW (ref 98–111)
Creatinine, Ser: 0.95 mg/dL (ref 0.61–1.24)
GFR, Estimated: >60 mL/min (ref >60)
Glucose, Bld: 199 mg/dL — ABNORMAL HIGH (ref 70–99)
Potassium: 3.7 mmol/L (ref 3.5–5.1)
Sodium: 135 mmol/L (ref 135–145)

## 2022-03-10 LAB — RESP PANEL BY RT-PCR (RSV, FLU A&B, COVID)  RVPGX2
Influenza A by PCR: NEGATIVE
Influenza B by PCR: NEGATIVE
Resp Syncytial Virus by PCR: NEGATIVE
SARS Coronavirus 2 by RT PCR: NEGATIVE

## 2022-03-10 LAB — GROUP A STREP BY PCR: Group A Strep by PCR: DETECTED — AB

## 2022-03-10 MED ORDER — KETOROLAC TROMETHAMINE 15 MG/ML IJ SOLN
7.5000 mg | Freq: Once | INTRAMUSCULAR | Status: AC
  Administered 2022-03-10: 7.5 mg via INTRAVENOUS
  Filled 2022-03-10: qty 1

## 2022-03-10 MED ORDER — LIDOCAINE VISCOUS HCL 2 % MT SOLN
15.0000 mL | Freq: Once | OROMUCOSAL | Status: AC
  Administered 2022-03-10: 15 mL via OROMUCOSAL
  Filled 2022-03-10: qty 15

## 2022-03-10 MED ORDER — OXYCODONE HCL 5 MG PO TABS: 5.0000 mg | ORAL_TABLET | ORAL | 0 refills | Status: AC | PRN

## 2022-03-10 MED ORDER — HYDROCODONE-ACETAMINOPHEN 5-325 MG PO TABS
1.0000 | ORAL_TABLET | ORAL | Status: DC
  Filled 2022-03-10: qty 1

## 2022-03-10 MED ORDER — AMOXICILLIN-POT CLAVULANATE 400-57 MG/5ML PO SUSR: 875.0000 mg | Freq: Two times a day (BID) | ORAL | 0 refills | Status: AC

## 2022-03-10 MED ORDER — SODIUM CHLORIDE 0.9 % IV SOLN
3.0000 g | INTRAVENOUS | Status: AC
  Administered 2022-03-10: 3 g via INTRAVENOUS

## 2022-03-10 MED ORDER — METHYLPREDNISOLONE 4 MG PO TBPK: ORAL_TABLET | ORAL | 0 refills | Status: AC

## 2022-03-10 MED ORDER — IOHEXOL 300 MG/ML  SOLN
100.0000 mL | Freq: Once | INTRAMUSCULAR | Status: AC | PRN
  Administered 2022-03-10: 75 mL via INTRAVENOUS

## 2022-03-10 NOTE — ED Notes (Signed)
Pt continues to refuse admission -- understands that he is leaving AMA and has signed [facility] electronic documentation -- this nurse has verbally reviewed instructions for medications to be picked up as well as follow up with ENT and when to return to ER - pt acknowledges verbal understanding; denies any addl questions concerns needs- ambulatory at d/c independently escorted by family member

## 2022-03-10 NOTE — ED Notes (Signed)
Pt oob to hall bathroom - ambulates independently with steady gait; no distress noted.

## 2022-03-10 NOTE — ED Notes (Signed)
Pt verbalizes wanting to be d/c'd home with prescription to manage strep throat and inflammation and states he will see his pcp in the am -- Dr. Philip Aspen notified and will f/u directly with patient -- per Dr. Philip Aspen pt leaving would be AMA as ENT recommendations are for patient to be admitted to manage case.

## 2022-03-10 NOTE — ED Provider Notes (Signed)
Sergio Tyler Provider Note   CSN: [id number] Arrival date & time: 03/10/22  1609     History  Chief Complaint  Patient presents with   Sore Throat    Sergio Tyler is a 47 y.o. male.  47 year old male with history of CAD status post CABG and diabetes who presents emergency department with sore throat.  2 days ago started developing sore throat that is worse on the right than the left.  No fevers.  Says that he has had difficulty speaking and swallowing because of this and occasionally have to spit up.  Saw an outpatient doctor today who referred him to the emergency department for additional evaluation.       Home Medications Prior to Admission medications   Medication Sig Start Date End Date Taking? Authorizing Provider  amoxicillin-clavulanate (AUGMENTIN) 400-57 MG/5ML suspension Take 10.9 mLs (875 mg total) by mouth 2 (two) times daily for 14 days. 03/10/22 03/24/22 Yes Fransico Meadow, MD  methylPREDNISolone (MEDROL DOSEPAK) 4 MG TBPK tablet Take as indicated on the package 03/10/22  Yes Fransico Meadow, MD  oxyCODONE (ROXICODONE) 5 MG immediate release tablet Take 1 tablet (5 mg total) by mouth every 4 (four) hours as needed for severe pain. 03/10/22  Yes Fransico Meadow, MD  amLODipine (NORVASC) 5 MG tablet Take 5 mg by mouth daily. Patient not taking: Reported on 08/09/2020    [provider]  aspirin EC 81 MG tablet Take 81 mg by mouth daily. Patient not taking: Reported on 08/09/2020    [provider]  atorvastatin (LIPITOR) 80 MG tablet Take 80 mg by mouth daily at 6 PM. Patient not taking: Reported on 08/09/2020    [provider]  clopidogrel (PLAVIX) 75 MG tablet Take 75 mg by mouth daily. Patient not taking: Reported on 08/09/2020    [provider]  Diclofenac Sodium CR 100 MG 24 hr tablet Take 1 tablet (100 mg total) by mouth daily. 08/09/20   Palumbo, April, MD  lisinopril  (PRINIVIL,ZESTRIL) 10 MG tablet Take 10 mg by mouth daily. Patient not taking: Reported on 08/09/2020    [provider]  metoprolol (TOPROL-XL) 200 MG 24 hr tablet Take 200 mg by mouth daily. Patient not taking: Reported on 08/09/2020    [provider]      Allergies    Patient has no known allergies.    Review of Systems   Review of Systems  Physical Exam Updated Vital Signs BP (!) 145/106   Pulse (!) 120   Temp 98 F (36.7 C) (Oral)   Resp 18   SpO2 100%  Physical Exam Vitals and nursing note reviewed.  Constitutional:      General: He is not in acute distress.    Appearance: He is well-developed.  HENT:     Head: Normocephalic and atraumatic.     Right Ear: External ear normal.     Left Ear: External ear normal.     Nose: Nose normal.     Mouth/Throat:     Comments: Mallampati of 4.  Difficulty fully visualizing the tonsils.  Right tonsil with exudates and erythema.  Erythema noted of left tonsil.  Uvula appears displaced to the left. Eyes:     Extraocular Movements: Extraocular movements intact.     Conjunctiva/sclera: Conjunctivae normal.     Pupils: Pupils are equal, round, and reactive to light.  Neck:     Comments: Cervical lymphadenopathy Cardiovascular:  Rate and Rhythm: Regular rhythm. Tachycardia present.  Pulmonary:     Effort: Pulmonary effort is normal. No respiratory distress.     Comments: No stridor Musculoskeletal:     Cervical back: Normal range of motion and neck supple.  Skin:    General: Skin is warm and dry.  Neurological:     Mental Status: He is alert. Mental status is at baseline.  Psychiatric:        Mood and Affect: Mood normal.        Behavior: Behavior normal.     ED Results / Procedures / Treatments   Labs (all labs ordered are listed, but only abnormal results are displayed) Labs Reviewed  GROUP A STREP BY PCR - Abnormal; Notable for the following components:      Result Value   Group A Strep by PCR  DETECTED (*)    All other components within normal limits  BASIC METABOLIC PANEL - Abnormal; Notable for the following components:   Chloride 97 (*)    Glucose, Bld 199 (*)    All other components within normal limits  CBC - Abnormal; Notable for the following components:   WBC 15.2 (*)    HCT 37.8 (*)    All other components within normal limits  RESP PANEL BY RT-PCR (RSV, FLU A&B, COVID)  [id number]    EKG None  Radiology CT Soft Tissue Neck W Contrast  Result Date: 03/10/2022 CLINICAL DATA:  Concern for peritonsillar abscess. Sore throat, painful to swallow with pain into right ear x2 days. EXAM: CT NECK WITH CONTRAST TECHNIQUE: Multidetector CT imaging of the neck was performed using the standard protocol following the bolus administration of intravenous contrast. RADIATION DOSE REDUCTION: This exam was performed according to the departmental dose-optimization program which includes automated exposure control, adjustment of the mA and/or kV according to patient size and/or use of iterative reconstruction technique. CONTRAST:  39m OMNIPAQUE IOHEXOL 300 MG/ML  SOLN COMPARISON:  None Available. FINDINGS: Pharynx and larynx: Extensive submucosal edema in the right prestyloid parapharyngeal space with associated retropharyngeal fluid, favored to be reactive. No organized fluid collection. Salivary glands: Mild edema of the right submandibular gland, favored to be reactive. Thyroid: Normal. Lymph nodes: Mildly enlarged right level 2 and 1 B lymph nodes, likely reactive. Vascular: Atherosclerotic calcifications of the carotid bulbs. Limited intracranial: Unremarkable. Visualized orbits: Unremarkable. Mastoids and visualized paranasal sinuses: Well aerated. Skeleton: No acute or aggressive process. Upper chest: Unremarkable. Other: None. IMPRESSION: 1. Extensive submucosal edema in the right prestyloid parapharyngeal space with associated retropharyngeal fluid, favored to be reactive. No organized fluid  collection. 2. Mild edema of the right submandibular gland, favored to be reactive. 3. Mildly enlarged right level 2 and 1 B lymph nodes, also likely reactive. Electronically Signed   By: WEmmit AlexandersM.D.   On: 03/10/2022 18:30    Procedures Procedures   Medications Ordered in ED Medications  HYDROcodone-acetaminophen (NORCO/VICODIN) 5-325 MG per tablet 1 tablet (1 tablet Oral Not Given 03/10/22 1715)  ketorolac (TORADOL) 15 MG/ML injection 7.5 mg (7.5 mg Intravenous Given 03/10/22 1714)  lidocaine (XYLOCAINE) 2 % viscous mouth solution 15 mL (15 mLs Mouth/Throat Given 03/10/22 1715)  Ampicillin-Sulbactam (UNASYN) 3 g in sodium chloride 0.9 % 100 mL IVPB (0 g Intravenous Stopped 03/10/22 1758)  iohexol (OMNIPAQUE) 300 MG/ML solution 100 mL (75 mLs Intravenous Contrast Given 03/10/22 1800)    ED Course/ Medical Decision Making/ A&P Clinical Course as of 03/11/22 0057  Thu Mar 10, 2022  1911 Extensive edema in right prestyloid space and retropharyngeal fluid on CT.  Will reach out to ENT. [RP]  1924 Dr Janace Hoard [RP]    Clinical Course User Index [RP] Fransico Meadow, MD                            Medical Decision Making Amount and/or Complexity of Data Reviewed Labs: ordered. Radiology: ordered.  Risk Prescription drug management.   Sergio Tyler is a 47 y.o. male with comorbidities that complicate the patient evaluation including CAD status post CABG who presents emergency department with sore throat and asymmetric swelling with uvular deviation  Initial Ddx:  Peritonsillar abscess, strep pharyngitis, RPA  MDM:  Feel the patient may have peritonsillar abscess or strep pharyngitis based on his exam.  Will obtain CT scan to further evaluate for deep space infection.  Appears to be protecting his airway at this time is tachycardic which is likely due to sepsis due to strep pharyngitis.  Plan:  Labs Unasyn Consider Decadron but was given prior to arrival CT neck with IV  contrast  ED Summary/Re-evaluation:  Patient reassessed remained stable in the emergency department.  No airway concerns.  Did have a CT scan that showed retropharyngeal fluid.  Discussed this with the patient and the concerns for possible airway compromise but he requested to go home.  Was discussed with ENT prior to discharge who initially recommended admission for IV antibiotics and observation.  Patient was again informed of these recommendations but he opted to go home [place].  Do feel the patient has capacity to make this decision at this time.  He was sent home with antibiotics, steroids, and pain medication.  Instructed follow-up with ENT in several days.  Strict return precautions discussed prior to discharge.  This patient presents to the ED for concern of complaints listed in HPI, this involves an extensive number of treatment options, and is a complaint that carries with it a high risk of complications and morbidity. Disposition including potential need for admission considered.   Dispo: DC Home. Return precautions discussed including, but not limited to, those listed in the AVS. Allowed pt time to ask questions which were answered fully prior to dc.  Additional history obtained from significant other Records reviewed Outpatient Clinic Notes The following labs were independently interpreted: CBC and show  elevated white blood cell count in setting of tachycardia concerning for sepsis I independently reviewed the following imaging with scope of interpretation limited to determining acute life threatening conditions related to emergency care:  CT neck  and agree with the radiologist interpretation with the following exceptions: None I personally reviewed and interpreted cardiac monitoring: sinus tachycardia I personally reviewed and interpreted the pt's EKG: see above for interpretation  I have reviewed the patients home medications and made adjustments as needed Consults:   ENT  Final Clinical Impression(s) / ED Diagnoses Final diagnoses:  Strep pharyngitis  Sepsis, due to unspecified organism, unspecified whether acute organ dysfunction present [facility])    Rx / DC Orders ED Discharge Orders          Ordered    methylPREDNISolone (MEDROL DOSEPAK) 4 MG TBPK tablet        03/10/22 2052    amoxicillin-clavulanate (AUGMENTIN) 400-57 MG/5ML suspension  2 times daily        03/10/22 2052    oxyCODONE (ROXICODONE) 5 MG immediate release tablet  Every 4 hours PRN  03/10/22 2052              Fransico Meadow, MD 03/11/22 [id number]

## 2022-03-10 NOTE — ED Triage Notes (Addendum)
Sore throat,  painful to swallow, pain into right ear X 2 day Seen at [facility]  Referred to ED for imaging Swelling and tender right side neck

## 2022-03-10 NOTE — Discharge Instructions (Addendum)
You were seen for your strep throat in the emergency department.  You had a CT scan that showed retropharyngeal fluid that is concerning for developing retropharyngeal abscess.  At home, please take the antibiotics we have prescribed you and the steroids to treat the infection and swelling.  Take Tylenol for any pain that you have and oxycodone for any breakthrough pain. Do not take oxycodone before driving or operating heavy machinery.  Do not take this medication with alcohol.    Check your MyChart online for the results of any tests that had not resulted by the time you left the emergency department.   Follow-up with the ENT doctors in 2 to 3 days regarding your visit.  Please also follow-up with your primary doctor in several days regarding your visit. If you do not have a primary care doctor you may follow-up with [facility] which is listed in this packet.  Return immediately to the emergency department if you experience any of the following: Difficulty speaking, inability to swallow your spit, difficulty breathing, or any other concerning symptoms.    Thank you for visiting our Emergency Department. It was a pleasure taking care of you today.

## 2022-03-10 NOTE — ED Notes (Signed)
Patient transported to CT 
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
